# Patient Record
Sex: Male | Born: 2005 | Hispanic: No | Marital: Single | Smoking: Never smoker
Health system: Southern US, Community
[De-identification: ages and names within clinical notes are randomized; demographics above are authoritative.]

---
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
## 2023-01-29 ENCOUNTER — Encounter (HOSPITAL_COMMUNITY): Payer: Self-pay

## 2023-01-29 ENCOUNTER — Ambulatory Visit (HOSPITAL_COMMUNITY)
Admission: EM | Admit: 2023-01-29 | Discharge: 2023-01-29 | Disposition: A | Discharge status: Home / Self Care | Payer: Medicaid Other | Attending: Neurology | Admitting: Neurology

## 2023-01-29 DIAGNOSIS — J309 Allergic rhinitis, unspecified: Secondary | ICD-10-CM

## 2023-01-29 DIAGNOSIS — J019 Acute sinusitis, unspecified: Secondary | ICD-10-CM | POA: Diagnosis not present

## 2023-01-29 MED ORDER — FLUTICASONE PROPIONATE 50 MCG/ACT NA SUSP: 2.0000 | Freq: Every day | NASAL | 1 refills | Status: AC

## 2023-01-29 MED ORDER — CETIRIZINE HCL 10 MG PO TABS: 10.0000 mg | ORAL_TABLET | Freq: Every day | ORAL | 2 refills | Status: AC
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
## 2023-01-29 NOTE — Discharge Instructions (Addendum)
Your symptoms are likely due to environmental allergies.  Avoid exposure to allergens. Take oral antihistamine (Either Zyrtec, Claritin, or Allegra) and use Flonase daily as directed. You can buy these medications over the counter. These medications can take a few days to fully kick in to your body and start working. Return for any new or worsening symptoms.  If your eyes become itchy, you may purchase olopatadine (Pataday) eyedrops over the counter and use as directed to relieve watery/itchy eyes associated with allergies as well.   If your symptoms are severe, please go to the emergency room for further evaluation. Schedule an appointment with your primary care provider for follow-up and further management of your seasonal allergies as well as ongoing preventive healthcare.

## 2023-01-29 NOTE — ED Triage Notes (Signed)
Patient c/o a non productive cough and chills since yesterday.  Patient denies taking any medication for his symptoms.

## 2023-05-14 NOTE — Progress Notes (Deleted)
  SUBJECTIVE:   CHIEF COMPLAINT / HPI:   Presents for citizenship discussed trends.  Arabic interpreter used throughout encounter  PERTINENT  PMH / PSH: Allergic rhinitis  OBJECTIVE:  There were no vitals taken for this visit. ***  ASSESSMENT/PLAN:   Assessment & Plan  No follow-ups on file. Shelby Mattocks, DO 05/14/2023, 2:09 PM PGY-3, [facility] {    This will disappear when note is signed, click to select method of visit    :1}

## 2023-05-15 ENCOUNTER — Ambulatory Visit: Payer: Medicaid Other | Admitting: Student

## 2023-05-15 ENCOUNTER — Ambulatory Visit: Payer: Medicaid Other | Admitting: Family Medicine
